# Patient Record
Sex: Female | Born: 1940 | Race: White | Hispanic: No | Smoking: Never smoker
Health system: Southern US, Community
[De-identification: ages and names within clinical notes are randomized; demographics above are authoritative.]

## PROBLEM LIST (undated history)

## (undated) DIAGNOSIS — IMO0001 Reserved for inherently not codable concepts without codable children: Secondary | ICD-10-CM

## (undated) DIAGNOSIS — H409 Unspecified glaucoma: Secondary | ICD-10-CM

## (undated) DIAGNOSIS — F419 Anxiety disorder, unspecified: Secondary | ICD-10-CM

## (undated) DIAGNOSIS — R233 Spontaneous ecchymoses: Secondary | ICD-10-CM

## (undated) DIAGNOSIS — R238 Other skin changes: Secondary | ICD-10-CM

## (undated) DIAGNOSIS — K589 Irritable bowel syndrome without diarrhea: Secondary | ICD-10-CM

## (undated) DIAGNOSIS — F32A Depression, unspecified: Secondary | ICD-10-CM

## (undated) DIAGNOSIS — K219 Gastro-esophageal reflux disease without esophagitis: Secondary | ICD-10-CM

## (undated) DIAGNOSIS — I10 Essential (primary) hypertension: Secondary | ICD-10-CM

## (undated) DIAGNOSIS — R251 Tremor, unspecified: Secondary | ICD-10-CM

## (undated) DIAGNOSIS — E079 Disorder of thyroid, unspecified: Secondary | ICD-10-CM

## (undated) DIAGNOSIS — K259 Gastric ulcer, unspecified as acute or chronic, without hemorrhage or perforation: Secondary | ICD-10-CM

## (undated) DIAGNOSIS — Z87448 Personal history of other diseases of urinary system: Secondary | ICD-10-CM

## (undated) DIAGNOSIS — E119 Type 2 diabetes mellitus without complications: Secondary | ICD-10-CM

## (undated) DIAGNOSIS — H9190 Unspecified hearing loss, unspecified ear: Secondary | ICD-10-CM

## (undated) DIAGNOSIS — F329 Major depressive disorder, single episode, unspecified: Secondary | ICD-10-CM

## (undated) DIAGNOSIS — D649 Anemia, unspecified: Secondary | ICD-10-CM

## (undated) HISTORY — DX: Major depressive disorder, single episode, unspecified: F32.9

## (undated) HISTORY — DX: Disorder of thyroid, unspecified: E07.9

## (undated) HISTORY — DX: Unspecified hearing loss, unspecified ear: H91.90

## (undated) HISTORY — DX: Anemia, unspecified: D64.9

## (undated) HISTORY — DX: Irritable bowel syndrome without diarrhea: K58.9

## (undated) HISTORY — DX: Essential (primary) hypertension: I10

## (undated) HISTORY — DX: Unspecified glaucoma: H40.9

## (undated) HISTORY — DX: Type 2 diabetes mellitus without complications: E11.9

## (undated) HISTORY — DX: Other skin changes: R23.8

## (undated) HISTORY — DX: Spontaneous ecchymoses: R23.3

## (undated) HISTORY — DX: Anxiety disorder, unspecified: F41.9

## (undated) HISTORY — DX: Gastric ulcer, unspecified as acute or chronic, without hemorrhage or perforation: K25.9

## (undated) HISTORY — DX: Depression, unspecified: F32.A

## (undated) HISTORY — DX: Tremor, unspecified: R25.1

## (undated) HISTORY — DX: Gastro-esophageal reflux disease without esophagitis: K21.9

## (undated) HISTORY — DX: Reserved for inherently not codable concepts without codable children: IMO0001

## (undated) HISTORY — DX: Personal history of other diseases of urinary system: Z87.448

---

## 2003-04-29 ENCOUNTER — Other Ambulatory Visit: Payer: Self-pay

## 2005-03-16 ENCOUNTER — Ambulatory Visit: Payer: Self-pay | Admitting: Internal Medicine

## 2005-05-03 ENCOUNTER — Emergency Department: Payer: Self-pay | Admitting: Emergency Medicine

## 2005-06-07 ENCOUNTER — Ambulatory Visit: Payer: Self-pay | Admitting: Gastroenterology

## 2005-09-18 ENCOUNTER — Ambulatory Visit: Payer: Self-pay | Admitting: Orthopaedic Surgery

## 2005-10-17 ENCOUNTER — Ambulatory Visit: Payer: Self-pay | Admitting: Oncology

## 2005-10-26 ENCOUNTER — Ambulatory Visit: Payer: Self-pay | Admitting: Orthopaedic Surgery

## 2005-11-27 LAB — CBC WITH DIFFERENTIAL (CANCER CENTER ONLY)
BASO#: 0.1 10*3/uL (ref 0.0–0.2)
BASO%: 1 % (ref 0.0–2.0)
EOS%: 5.7 % (ref 0.0–7.0)
HCT: 40 % (ref 34.8–46.6)
HGB: 13.4 g/dL (ref 11.6–15.9)
MCHC: 33.4 g/dL (ref 32.0–36.0)
MONO#: 0.7 10*3/uL (ref 0.1–0.9)
NEUT#: 7.4 10*3/uL — ABNORMAL HIGH (ref 1.5–6.5)
NEUT%: 56.5 % (ref 39.6–80.0)
RBC: 4.28 10*6/uL (ref 3.70–5.32)
RDW: 11.9 % (ref 10.5–14.6)

## 2005-12-06 ENCOUNTER — Ambulatory Visit: Payer: Self-pay | Admitting: Oncology

## 2005-12-07 LAB — CBC WITH DIFFERENTIAL (CANCER CENTER ONLY)
HCT: 40 % (ref 34.8–46.6)
HGB: 13.2 g/dL (ref 11.6–15.9)
MCH: 31.1 pg (ref 26.0–34.0)
MCHC: 33.1 g/dL (ref 32.0–36.0)
NEUT#: 8.2 10*3/uL — ABNORMAL HIGH (ref 1.5–6.5)
NEUT%: 55.8 % (ref 39.6–80.0)
Platelets: 314 10*3/uL (ref 145–400)

## 2006-01-08 ENCOUNTER — Ambulatory Visit (HOSPITAL_COMMUNITY): Admission: RE | Admit: 2006-01-08 | Discharge: 2006-01-08 | Payer: Self-pay | Admitting: Internal Medicine

## 2006-01-08 ENCOUNTER — Encounter (INDEPENDENT_AMBULATORY_CARE_PROVIDER_SITE_OTHER): Payer: Self-pay | Admitting: *Deleted

## 2006-01-31 ENCOUNTER — Ambulatory Visit: Payer: Self-pay | Admitting: Oncology

## 2006-02-01 LAB — CBC WITH DIFFERENTIAL (CANCER CENTER ONLY)
BASO#: 0.1 10*3/uL (ref 0.0–0.2)
EOS%: 3.4 % (ref 0.0–7.0)
Eosinophils Absolute: 0.4 10*3/uL (ref 0.0–0.5)
HCT: 41.5 % (ref 34.8–46.6)
LYMPH#: 4.6 10*3/uL — ABNORMAL HIGH (ref 0.9–3.3)
LYMPH%: 38.5 % (ref 14.0–48.0)
MCH: 31.3 pg (ref 26.0–34.0)
MCHC: 33.7 g/dL (ref 32.0–36.0)
MCV: 93 fL (ref 81–101)
MONO#: 0.7 10*3/uL (ref 0.1–0.9)
NEUT#: 6.1 10*3/uL (ref 1.5–6.5)
NEUT%: 51.6 % (ref 39.6–80.0)
Platelets: 304 10*3/uL (ref 145–400)
WBC: 11.8 10*3/uL — ABNORMAL HIGH (ref 3.9–10.0)

## 2006-04-10 ENCOUNTER — Ambulatory Visit: Payer: Self-pay | Admitting: Internal Medicine

## 2006-08-02 ENCOUNTER — Ambulatory Visit: Payer: Self-pay | Admitting: Cardiovascular Disease

## 2006-10-17 ENCOUNTER — Ambulatory Visit: Payer: Self-pay | Admitting: Gastroenterology

## 2006-10-23 ENCOUNTER — Ambulatory Visit: Payer: Self-pay | Admitting: Gastroenterology

## 2007-02-07 ENCOUNTER — Ambulatory Visit: Payer: Self-pay | Admitting: Oncology

## 2007-02-08 LAB — CBC WITH DIFFERENTIAL (CANCER CENTER ONLY)
BASO#: 0.1 10*3/uL (ref 0.0–0.2)
EOS%: 3.4 % (ref 0.0–7.0)
Eosinophils Absolute: 0.5 10*3/uL (ref 0.0–0.5)
LYMPH#: 4.1 10*3/uL — ABNORMAL HIGH (ref 0.9–3.3)
LYMPH%: 29.2 % (ref 14.0–48.0)
MCV: 93 fL (ref 81–101)
MONO#: 0.8 10*3/uL (ref 0.1–0.9)
NEUT#: 8.4 10*3/uL — ABNORMAL HIGH (ref 1.5–6.5)
RDW: 12.2 % (ref 10.5–14.6)

## 2007-04-11 ENCOUNTER — Ambulatory Visit: Payer: Self-pay | Admitting: Internal Medicine

## 2007-10-21 ENCOUNTER — Ambulatory Visit: Payer: Self-pay

## 2008-02-05 ENCOUNTER — Ambulatory Visit: Payer: Self-pay | Admitting: Oncology

## 2008-03-17 ENCOUNTER — Ambulatory Visit: Payer: Self-pay | Admitting: Cardiovascular Disease

## 2008-03-27 ENCOUNTER — Ambulatory Visit: Payer: Self-pay | Admitting: Cardiovascular Disease

## 2008-06-02 ENCOUNTER — Ambulatory Visit: Payer: Self-pay | Admitting: Internal Medicine

## 2008-09-02 ENCOUNTER — Inpatient Hospital Stay: Payer: Self-pay | Admitting: Cardiovascular Disease

## 2008-09-04 ENCOUNTER — Ambulatory Visit: Payer: Self-pay | Admitting: Cardiovascular Disease

## 2008-09-09 ENCOUNTER — Ambulatory Visit: Payer: Self-pay

## 2008-12-03 ENCOUNTER — Ambulatory Visit: Payer: Self-pay | Admitting: Internal Medicine

## 2009-03-19 ENCOUNTER — Ambulatory Visit: Payer: Self-pay | Admitting: Neurology

## 2009-05-31 ENCOUNTER — Ambulatory Visit: Payer: Self-pay | Admitting: Gastroenterology

## 2009-06-14 ENCOUNTER — Ambulatory Visit: Payer: Self-pay | Admitting: Internal Medicine

## 2009-10-28 ENCOUNTER — Ambulatory Visit: Payer: Self-pay | Admitting: Internal Medicine

## 2009-11-10 ENCOUNTER — Ambulatory Visit: Payer: Self-pay | Admitting: Gastroenterology

## 2010-01-23 ENCOUNTER — Encounter: Payer: Self-pay | Admitting: Oncology

## 2010-07-14 ENCOUNTER — Ambulatory Visit: Payer: Self-pay | Admitting: Internal Medicine

## 2010-07-27 ENCOUNTER — Ambulatory Visit: Payer: Self-pay | Admitting: Cardiovascular Disease

## 2011-05-31 ENCOUNTER — Ambulatory Visit: Payer: Self-pay | Admitting: Unknown Physician Specialty

## 2011-07-18 ENCOUNTER — Ambulatory Visit: Payer: Self-pay | Admitting: Internal Medicine

## 2012-08-29 ENCOUNTER — Ambulatory Visit: Payer: Self-pay | Admitting: Gastroenterology

## 2012-10-07 ENCOUNTER — Ambulatory Visit: Payer: Self-pay | Admitting: Internal Medicine

## 2012-10-28 ENCOUNTER — Institutional Professional Consult (permissible substitution): Payer: Self-pay | Admitting: Podiatry

## 2012-10-28 ENCOUNTER — Ambulatory Visit: Payer: Self-pay | Admitting: Podiatry

## 2012-10-29 ENCOUNTER — Ambulatory Visit: Payer: Self-pay | Admitting: Gastroenterology

## 2012-11-04 ENCOUNTER — Ambulatory Visit: Payer: Self-pay | Admitting: Podiatry

## 2012-11-14 ENCOUNTER — Encounter: Payer: Self-pay | Admitting: Podiatry

## 2012-11-20 ENCOUNTER — Encounter: Payer: Self-pay | Admitting: Podiatry

## 2012-11-20 ENCOUNTER — Ambulatory Visit (INDEPENDENT_AMBULATORY_CARE_PROVIDER_SITE_OTHER): Payer: Medicare Other | Admitting: Podiatry

## 2012-11-20 VITALS — BP 128/70 | HR 63 | Resp 16 | Ht 62.0 in | Wt 130.0 lb

## 2012-11-20 DIAGNOSIS — M2010 Hallux valgus (acquired), unspecified foot: Secondary | ICD-10-CM

## 2012-11-21 NOTE — Progress Notes (Signed)
Kristy Lloyd presents to [place] today as a 72 year old white female for surgical consult. She still having pain to the first metatarsophalangeal joint area of her right foot which is not allowing her to perform her daily activities. She has tried conservative therapies such as opening her shoes wider shoes physical therapy anti-inflammatories all to no avail she had to be very careful with the anti-inflammatory secondary to her Plavix. Nothing seems to help.  Objective: Vital signs are stable she is alert and oriented x3. I have reviewed her past medical history medications and allergies. And we do have medical clearance from her primary Dr. Bernadette Hoit are strongly palpable to the right foot. She has pain on palpation and range of motion to the first metatarsophalangeal joint right. Radiographic evaluation of the foot does demonstrate hallux abductovalgus deformity with mild dislocation. I am concerned that she has subchondral sclerosis and joint space narrowing been very well may indicate osteoarthritis to this joint. If this is the case in more than 50% of her cartilage is destroyed then a Keller arthroplasty will be necessary.  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  Plan: We discussed the etiology pathology conservative versus surgical therapies today we went over consent form line bylined number by number giving her ample time to ask questions she saw fit regarding an Austin bunion repair with screw and a possible Keller arthroplasty with single silicone implant. I answered all these questions to the best of my ability in layman's terms she understood this. We did discuss a possible postop complications which may include but are not limited to postop pain bleeding swelling infection worsening of her condition and need for further surgery. She signed Dr. pages of the consent form and we will have her scheduled for this case by the end of the year. She was dispensed a Cam  Walker today and I will followup with her in the near future.

## 2012-12-06 ENCOUNTER — Encounter: Payer: Self-pay | Admitting: Podiatry

## 2012-12-06 DIAGNOSIS — M2010 Hallux valgus (acquired), unspecified foot: Secondary | ICD-10-CM

## 2012-12-10 ENCOUNTER — Telehealth: Payer: Self-pay | Admitting: *Deleted

## 2012-12-10 NOTE — Telephone Encounter (Signed)
CALLED AND SPOKE WITH PT. STATES EVERYTHING IS DOING GOOD. TAKING ALL MEDS, STAYING OFF OF FOOT, ELEVATING AND ICE.

## 2012-12-12 ENCOUNTER — Ambulatory Visit (INDEPENDENT_AMBULATORY_CARE_PROVIDER_SITE_OTHER): Payer: Medicare Other

## 2012-12-12 ENCOUNTER — Ambulatory Visit (INDEPENDENT_AMBULATORY_CARE_PROVIDER_SITE_OTHER): Payer: Medicare Other | Admitting: Podiatry

## 2012-12-12 VITALS — BP 114/60 | HR 60 | Temp 98.1°F | Resp 18

## 2012-12-12 DIAGNOSIS — Z9889 Other specified postprocedural states: Secondary | ICD-10-CM

## 2012-12-12 NOTE — Progress Notes (Signed)
   Subjective:    Patient ID: Kristy Lloyd, female    DOB: [birth year], 72 y.o.   MRN: [id number]  HPI Comments: "not bad a little achey "      Review of Systems     Objective:   Physical Exam: Vital signs are stable she is alert and oriented x3 dressing to the left foot is intact. Once removed demonstrates well-healing surgical foot status post Keller arthroplasty single silicone implant. Radiographs confirm good position of the implant. Grommets are in place.         Assessment & Plan:  Assessment: Well-healing surgical Keller arthroplasty single silicone implant left.   Plan: Redressed foot dry sterile compressive dressing put in a Darco shoe and encouraged range of motion exercises.

## 2012-12-19 ENCOUNTER — Ambulatory Visit (INDEPENDENT_AMBULATORY_CARE_PROVIDER_SITE_OTHER): Payer: Medicare Other | Admitting: Podiatry

## 2012-12-19 ENCOUNTER — Encounter: Payer: Self-pay | Admitting: Podiatry

## 2012-12-19 VITALS — BP 129/60 | HR 65 | Temp 96.8°F | Resp 14

## 2012-12-19 DIAGNOSIS — Z9889 Other specified postprocedural states: Secondary | ICD-10-CM

## 2012-12-19 NOTE — Progress Notes (Signed)
   Subjective:    Patient ID: Kristy Lloyd, female    DOB: 1940-10-20, 72 y.o.   MRN: [id number]  HPI Comments: Post op 12.5.14 right foot , " it aches"     Review of Systems     Objective:   Physical Exam: Vital signs are stable she is alert and oriented x3. Pulses remain palpable right lower extremity. Two-week status post Keller arthroplasty single silicone implant with grommets. She still has tenderness on palpation and range of motion of the first metatarsophalangeal joint right foot. No erythema edema cellulitis drainage or odor.        Assessment & Plan:  Assessment: Well-healing surgical foot status post Keller arthroplasty single silicone implant with grommets right.  Plan: Discontinue use of the Cam Walker I put in a compression anklet and she will start utilizing a Darco shoe no followup with her in 2 weeks for another set of x-rays I encouraged range of motion exercises.

## 2012-12-30 ENCOUNTER — Encounter: Payer: Self-pay | Admitting: Podiatry

## 2012-12-30 ENCOUNTER — Ambulatory Visit (INDEPENDENT_AMBULATORY_CARE_PROVIDER_SITE_OTHER): Payer: Medicare Other | Admitting: Podiatry

## 2012-12-30 ENCOUNTER — Ambulatory Visit (INDEPENDENT_AMBULATORY_CARE_PROVIDER_SITE_OTHER): Payer: Medicare Other

## 2012-12-30 VITALS — BP 138/73 | HR 61 | Resp 16 | Ht 62.0 in | Wt 129.6 lb

## 2012-12-30 DIAGNOSIS — Z9889 Other specified postprocedural states: Secondary | ICD-10-CM

## 2012-12-30 DIAGNOSIS — T814XXA Infection following a procedure, initial encounter: Secondary | ICD-10-CM

## 2012-12-30 MED ORDER — AMOXICILLIN-POT CLAVULANATE 875-125 MG PO TABS: 1.0000 | ORAL_TABLET | Freq: Two times a day (BID) | ORAL | Status: DC

## 2012-12-30 MED ORDER — MUPIROCIN 2 % EX OINT: TOPICAL_OINTMENT | CUTANEOUS | Status: DC

## 2012-12-30 NOTE — Progress Notes (Signed)
Shoulder presents today for postop visit regarding her right foot. She states is been swollen throbbing and red. She states she really has not stay off of it as much as she should have. She denies fever chills nausea vomiting muscle aches and pains.  Objective: Vital signs are stable she is alert oriented x3. Right foot demonstrates red swollen first metatarsophalangeal joint with tenderness on range of motion of the first metatarsophalangeal joint. Graphic evaluation does not demonstrate any signs of infection.  Assessment: Cellulitis with mild dehiscence of the distal wound first metatarsophalangeal joint right.  Plan: She will start Augmentin 875 twice a day. Soak in Epsom salts and water twice daily and apply Bactroban ointment. She will continue the use of the Darco shoe and staying off the foot. I will followup with her in one week she will call if symptoms worsen.

## 2013-01-06 ENCOUNTER — Encounter: Payer: Self-pay | Admitting: Podiatry

## 2013-01-06 ENCOUNTER — Ambulatory Visit (INDEPENDENT_AMBULATORY_CARE_PROVIDER_SITE_OTHER): Payer: Medicare Other | Admitting: Podiatry

## 2013-01-06 VITALS — BP 127/66 | HR 68 | Temp 97.6°F | Resp 18

## 2013-01-06 DIAGNOSIS — L02619 Cutaneous abscess of unspecified foot: Secondary | ICD-10-CM

## 2013-01-06 DIAGNOSIS — L03119 Cellulitis of unspecified part of limb: Principal | ICD-10-CM

## 2013-01-06 MED ORDER — AMOXICILLIN-POT CLAVULANATE 875-125 MG PO TABS: 1.0000 | ORAL_TABLET | Freq: Two times a day (BID) | ORAL | Status: DC

## 2013-01-06 MED ORDER — CLINDAMYCIN HCL 150 MG PO CAPS: 150.0000 mg | ORAL_CAPSULE | Freq: Three times a day (TID) | ORAL | Status: DC

## 2013-01-06 NOTE — Progress Notes (Signed)
   Subjective:    Patient ID: Kristy Lloyd, female    DOB: 08/29/1940, 73 y.o.   MRN: [id number]  HPI Comments: Post op visit right foot , think it looks worse than last week, it is very painful      Review of Systems     Objective:   Physical Exam: Vital signs are stable she is alert and oriented x3. Mild dehiscence of the wound hallux and first metatarsophalangeal joint right painful range of motion first metatarsophalangeal joint right secondary to infection. She continues to take her Augmentin. The foot appears to be much improved to me decrease in erythema and no drainage.        Assessment & Plan:  Assessment: Cellulitis first metatarsophalangeal joint right foot postop.  Plan: Continue Augmentin adding clindamycin 150 mg 3 times daily. Continue to soak twice daily in Epsom salts warm or right foot apply Bactroban ointment and cover. Continue the use of the Darco shoe and keep the foot elevated all times

## 2013-01-07 ENCOUNTER — Encounter: Payer: Self-pay | Admitting: Podiatry

## 2013-01-13 NOTE — Progress Notes (Signed)
1. KELLER ARTHROPLASTY WITH SINGLE SILICONE IMPLANT RIGHT FOOT

## 2013-01-22 ENCOUNTER — Encounter: Payer: Self-pay | Admitting: Podiatry

## 2013-01-22 ENCOUNTER — Ambulatory Visit (INDEPENDENT_AMBULATORY_CARE_PROVIDER_SITE_OTHER): Payer: Medicare Other | Admitting: Podiatry

## 2013-01-22 ENCOUNTER — Ambulatory Visit (INDEPENDENT_AMBULATORY_CARE_PROVIDER_SITE_OTHER): Payer: Medicare Other

## 2013-01-22 VITALS — BP 110/59 | HR 60 | Resp 18

## 2013-01-22 DIAGNOSIS — M2010 Hallux valgus (acquired), unspecified foot: Secondary | ICD-10-CM

## 2013-01-22 DIAGNOSIS — Z9889 Other specified postprocedural states: Secondary | ICD-10-CM

## 2013-01-22 MED ORDER — FLUCONAZOLE 150 MG PO TABS
150.0000 mg | ORAL_TABLET | Freq: Once | ORAL | Status: DC
Start: 2013-01-22 — End: 2013-02-05

## 2013-01-22 NOTE — Progress Notes (Signed)
Post op visit right foot d.o.s 12.5.14 , pt states that it is so much better than it was 2 weeks ago . She continues to take her antibiotics a regular basis. She does state that she started to develop a vaginal yeast infection.  Objective: Vital signs are stable she is alert and oriented x3. Hallux right is mildly erythematous along the incision site to very small areas that have left heel but is doing much better much decrease in symptoms with range of motion.  Assessment: Well-healing surgical foot with a postop infection first metatarsophalangeal joint right foot status post Keller arthroplasty single silicone implant with grommets.  Plan: Continue antibiotic therapy wrote her prescription for Diflucan and I will followup with her in 2 weeks to get another pair shoes. Otherwise conservative therapies will continue.

## 2013-02-05 ENCOUNTER — Encounter: Payer: Self-pay | Admitting: Podiatry

## 2013-02-05 ENCOUNTER — Ambulatory Visit (INDEPENDENT_AMBULATORY_CARE_PROVIDER_SITE_OTHER): Payer: Medicare Other | Admitting: Podiatry

## 2013-02-05 ENCOUNTER — Ambulatory Visit (INDEPENDENT_AMBULATORY_CARE_PROVIDER_SITE_OTHER): Payer: Medicare Other

## 2013-02-05 VITALS — BP 150/77 | HR 65 | Resp 16 | Ht 62.0 in | Wt 132.0 lb

## 2013-02-05 DIAGNOSIS — Z9889 Other specified postprocedural states: Secondary | ICD-10-CM

## 2013-02-05 NOTE — Progress Notes (Signed)
Kristy ForestShirley presents today for followup of her Kristy CoasterKeller arthroplasty single silicone implant right foot date of surgery 12/06/2012. She states finally it has healed. It's a little tight when I move it but it feels much better.  Objective: Vital signs are stable she is alert and oriented x3. Mild edema about the first metatarsophalangeal joint of the right foot. She has a good range of motion of the first metatarsophalangeal joint is nontender on palpation and her incision line is close completely  Assessment: Well-healing surgical foot right status post Kristy CoasterKeller December of 2014.  Plan: Get back to her regular shoe gear and will followup with her in one month

## 2013-03-05 ENCOUNTER — Encounter: Payer: Medicare Other | Admitting: Podiatry

## 2013-04-10 ENCOUNTER — Emergency Department: Payer: Self-pay | Admitting: Emergency Medicine

## 2013-04-10 LAB — CBC
HCT: 39.2 % (ref 35.0–47.0)
HGB: 12.9 g/dL (ref 12.0–16.0)
MCH: 31.4 pg (ref 26.0–34.0)
MCHC: 32.8 g/dL (ref 32.0–36.0)
MCV: 96 fL (ref 80–100)
Platelet: 206 10*3/uL (ref 150–440)
RBC: 4.1 10*6/uL (ref 3.80–5.20)
RDW: 14.4 % (ref 11.5–14.5)
WBC: 14.1 10*3/uL — ABNORMAL HIGH (ref 3.6–11.0)

## 2013-04-10 LAB — COMPREHENSIVE METABOLIC PANEL
ANION GAP: 1 — AB (ref 7–16)
AST: 54 U/L — AB (ref 15–37)
Albumin: 3.9 g/dL (ref 3.4–5.0)
Alkaline Phosphatase: 61 U/L
BILIRUBIN TOTAL: 0.6 mg/dL (ref 0.2–1.0)
BUN: 25 mg/dL — AB (ref 7–18)
CO2: 32 mmol/L (ref 21–32)
CREATININE: 1.17 mg/dL (ref 0.60–1.30)
Calcium, Total: 9.3 mg/dL (ref 8.5–10.1)
Chloride: 104 mmol/L (ref 98–107)
EGFR (Non-African Amer.): 46 — ABNORMAL LOW
GFR CALC AF AMER: 54 — AB
Glucose: 148 mg/dL — ABNORMAL HIGH (ref 65–99)
Osmolality: 281 (ref 275–301)
POTASSIUM: 4.9 mmol/L (ref 3.5–5.1)
SGPT (ALT): 32 U/L (ref 12–78)
Sodium: 137 mmol/L (ref 136–145)
TOTAL PROTEIN: 7.5 g/dL (ref 6.4–8.2)

## 2013-04-10 LAB — PROTIME-INR
INR: 1
PROTHROMBIN TIME: 12.6 s (ref 11.5–14.7)

## 2013-04-10 LAB — TROPONIN I: Troponin-I: <0.02 ng/mL

## 2014-10-30 IMAGING — CT CT CERVICAL SPINE WITHOUT CONTRAST
3 of 9 series · 9 of 33 positions shown, 10 images · non-contrast
Comparison: None.

CLINICAL DATA: Pain post trauma

EXAM:
CT HEAD WITHOUT CONTRAST
CT CERVICAL SPINE WITHOUT CONTRAST
TECHNIQUE: Multidetector CT imaging of the head and cervical spine was
performed following the standard protocol without intravenous
contrast. Multiplanar CT image reconstructions of the cervical spine
were also generated.

[Series 7: cor bone · coronal · 0.24mm/px · 2 of 39 slices shown]
[im 13/39  bone]
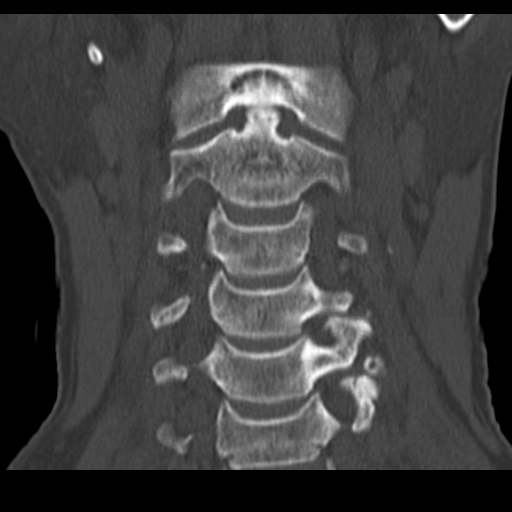
[im 26/39  bone]
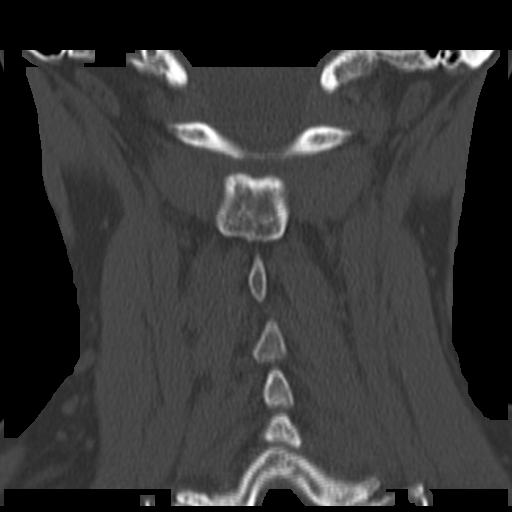

[Series 8: orthogonal axials · axial · 0.20mm/px · z∈[-651,-612]mm · 2 of 61 slices shown, 3 images]
[im 21/61  soft-tissue]
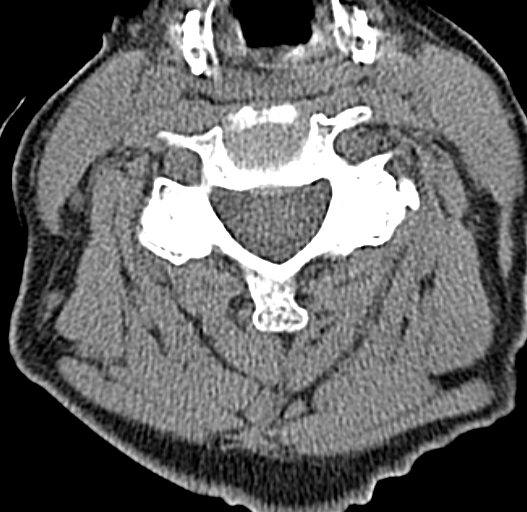
[im 21/61  bone]
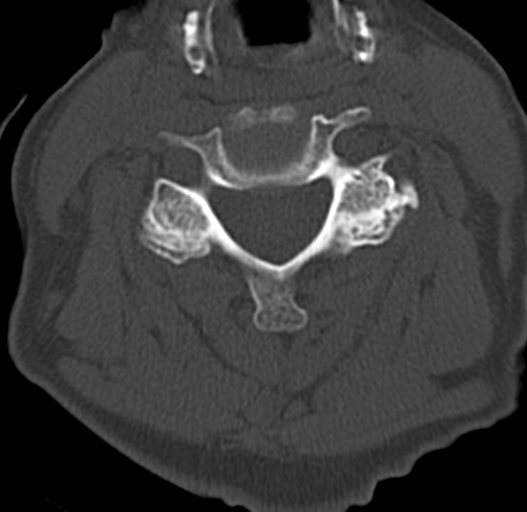
[im 41/61  bone]
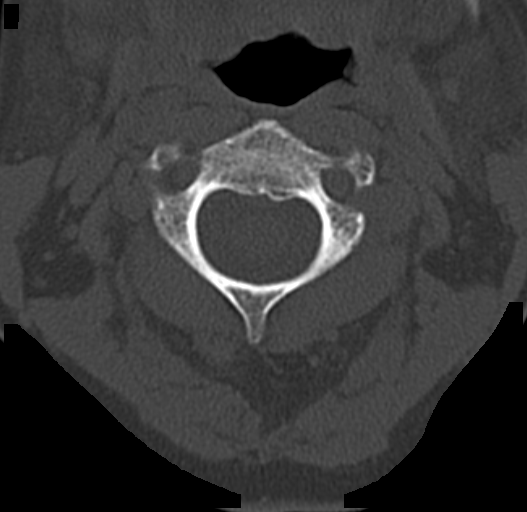

[Series 13: sag bone continued · sagittal · 0.15mm/px · 5 of 73 slices shown]
[im 11/73  bone]
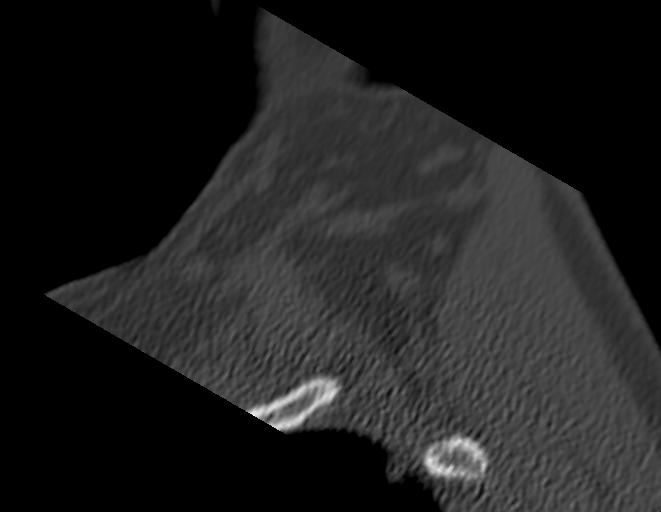
[im 21/73  bone]
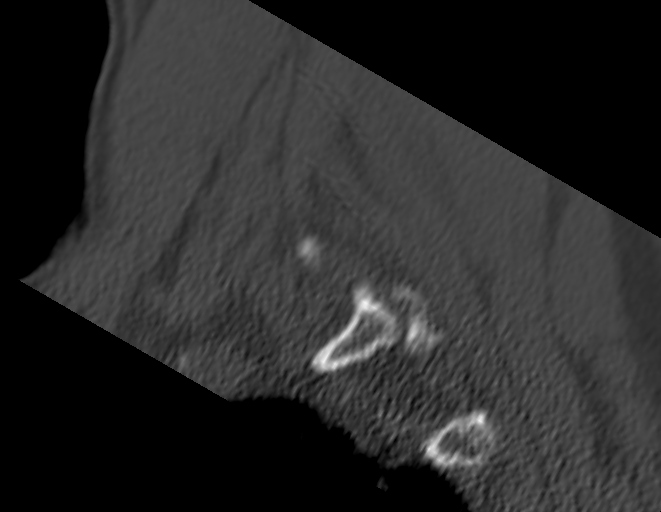
[im 31/73  bone]
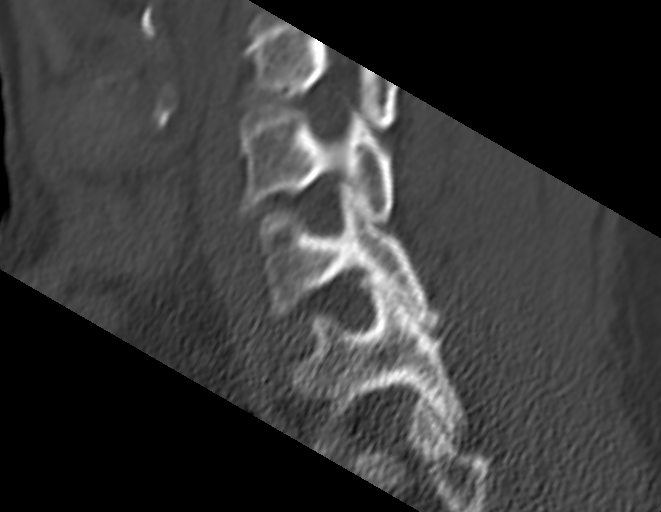
[im 42/73  bone]
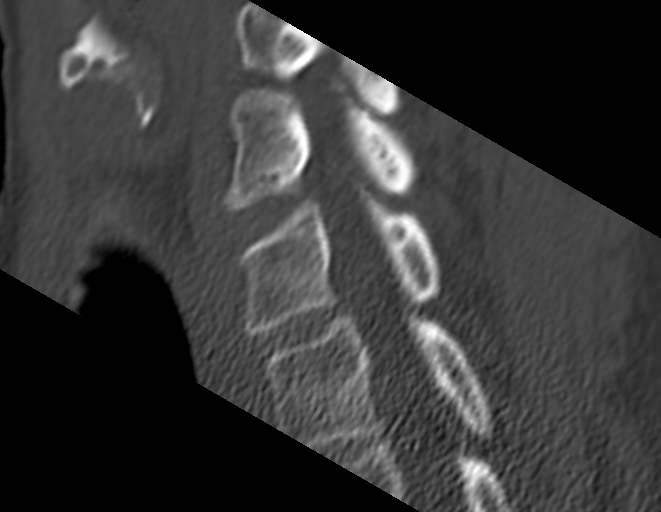
[im 52/73  bone]
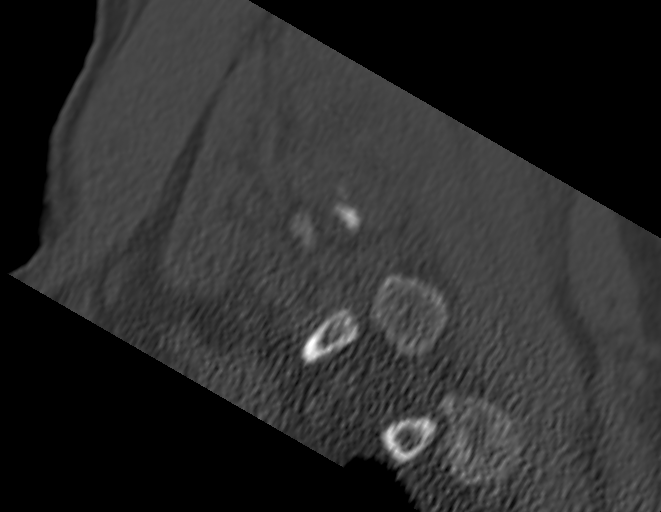

[9 of 33 positions shown; findings below may reference images not displayed]

FINDINGS: CT HEAD FINDINGS

There is age related volume loss. There is no mass, hemorrhage,
extra-axial fluid collection, or midline shift. There is patchy
small vessel disease in the centra semiovale bilaterally. Elsewhere
gray-white compartments appear normal. No acute infarct is apparent.

The bony calvarium appears intact. The mastoid air cells are clear.
There is a small benign frontal osteoma in the midline of the
frontal sinus region.

CT CERVICAL SPINE FINDINGS

There is no fracture or spondylolisthesis. Prevertebral soft tissues
and predental space regions are normal. There is no appreciable disc
space narrowing. There is facet hypertrophy at multiple levels
bilaterally. No disc extrusion or stenosis. There is calcification
in both carotid arteries.
IMPRESSION: CT head: Patchy periventricular small vessel disease. No
intracranial mass, hemorrhage, or extra-axial fluid. No acute
appearing infarct.

CT cervical spine: Multilevel osteoarthritic change. No fracture or
spondylolisthesis.
